# Patient Record
Sex: Male | Born: 1990 | Race: Black or African American | Hispanic: No | Marital: Single | Smoking: Never smoker
Health system: Southern US, Community
[De-identification: ages and names within clinical notes are randomized; demographics above are authoritative.]

## PROBLEM LIST (undated history)

## (undated) DIAGNOSIS — Z789 Other specified health status: Secondary | ICD-10-CM

## (undated) HISTORY — PX: BRAIN SURGERY: SHX531

## (undated) HISTORY — PX: ANKLE FRACTURE SURGERY: SHX122

---

## 2016-02-07 DIAGNOSIS — S82842A Displaced bimalleolar fracture of left lower leg, initial encounter for closed fracture: Secondary | ICD-10-CM | POA: Insufficient documentation

## 2017-09-18 ENCOUNTER — Encounter: Payer: Self-pay | Admitting: Emergency Medicine

## 2017-09-18 ENCOUNTER — Other Ambulatory Visit: Payer: Self-pay

## 2017-09-18 ENCOUNTER — Ambulatory Visit
Admission: EM | Admit: 2017-09-18 | Discharge: 2017-09-18 | Disposition: A | Discharge status: Home / Self Care | Payer: Managed Care, Other (non HMO) | Attending: Family Medicine | Admitting: Family Medicine

## 2017-09-18 DIAGNOSIS — R05 Cough: Secondary | ICD-10-CM

## 2017-09-18 DIAGNOSIS — R059 Cough, unspecified: Secondary | ICD-10-CM

## 2017-09-18 DIAGNOSIS — J0100 Acute maxillary sinusitis, unspecified: Secondary | ICD-10-CM | POA: Diagnosis not present

## 2017-09-18 HISTORY — DX: Other specified health status: Z78.9

## 2017-09-18 LAB — RAPID STREP SCREEN (MED CTR MEBANE ONLY): STREPTOCOCCUS, GROUP A SCREEN (DIRECT): NEGATIVE

## 2017-09-18 MED ORDER — BENZONATATE 100 MG PO CAPS: 100.0000 mg | ORAL_CAPSULE | Freq: Three times a day (TID) | ORAL | 0 refills | Status: DC | PRN

## 2017-09-18 MED ORDER — HYDROCOD POLST-CPM POLST ER 10-8 MG/5ML PO SUER: 5.0000 mL | Freq: Every evening | ORAL | 0 refills | Status: DC | PRN

## 2017-09-18 MED ORDER — DOXYCYCLINE HYCLATE 100 MG PO CAPS: 100.0000 mg | ORAL_CAPSULE | Freq: Two times a day (BID) | ORAL | 0 refills | Status: DC

## 2017-09-18 NOTE — ED Triage Notes (Signed)
Patient in today c/o 2 weeks history of body aches, headache, sore throat, cough, chest and nasal congestion and dizziness. Patient has had chills and hot flashes, but hasn't taken his temperature.

## 2017-09-18 NOTE — Discharge Instructions (Signed)
Take medication as prescribed. Rest. Drink plenty of fluids.  ° °Follow up with your primary care physician this week as needed. Return to Urgent care for new or worsening concerns.  ° °

## 2017-09-18 NOTE — ED Provider Notes (Signed)
[facility] ____________________________________________  Time seen: Approximately  1048 AM  I have reviewed the triage vital signs and the nursing notes.   HISTORY  Chief Complaint flu like symptoms   HPI Tyler Holt is a 26 y.o. male presenting for evaluation of overall 2 weeks of not feeling well.  States 2 weeks ago he started out with runny nose, nasal congestion and some coughing, reports after taking over-the-counter Alka-Seltzer, Sudafed and other decongestion medicines he has started to improve, but symptoms worsened over the last several days.  States symptoms never fully resolved.  States initially had some sore throat that improved and then returned over the last few days, with increased nasal drainage and coughing.  States cough is worse at night.  Denies associated chest pain, shortness of breath, pain with deep breath or hemoptysis.  States does have sinus pressure sensation as well as intermittent frontal headaches as described as moderate.  States able to get a lot of thicker yellowish thick mucus out when coughing and blowing nose.  Reports continues to drink plenty of fluids and overall eating well.  Denies known sick contacts, does work around a lot of people.  States no recent sickness.  States did take some TheraFlu this morning, that states he believes had Tylenol in it.  States has had intermittent chills and warm sensation, not measured his temperature at home, denies known fevers.  Denies other changes. Denies chest pain, shortness of breath, abdominal pain, extremity pain, extremity swelling or rash. Denies recent sickness. Denies recent antibiotic use.    Past Medical History:  Diagnosis Date  . No known health problems     There are no active problems to display for this patient.   Past Surgical History:  Procedure Laterality Date  . ANKLE FRACTURE SURGERY    . 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   Current Outpatient Medications:  .  benzonatate (TESSALON PERLES) 100 MG capsule, Take 1 capsule (100 mg total) by mouth 3 (three) times daily as needed for cough., Disp: 15 capsule, Rfl: 0 .  chlorpheniramine-HYDROcodone (TUSSIONEX PENNKINETIC ER) 10-8 MG/5ML SUER, Take 5 mLs by mouth at bedtime as needed for cough. do not drive or operate machinery while taking as can cause drowsiness., Disp: 75 mL, Rfl: 0 .  doxycycline (VIBRAMYCIN) 100 MG capsule, Take 1 capsule (100 mg total) by mouth 2 (two) times daily., Disp: 20 capsule, Rfl: 0  Allergies Patient has no known allergies.  Family History  Problem Relation Age of Onset  . Hypertension Mother   . Hypertension Father     Social History Social History   Tobacco Use  . Smoking status: Never Smoker  . Smokeless tobacco: Never Used  Substance Use Topics  . Alcohol use: Yes    Comment: socially  . Drug use: No    Review of Systems Constitutional: No fever/chills ENT: Positive sore throat. States irritated.  Cardiovascular: Denies chest pain. Respiratory: Denies shortness of breath. Gastrointestinal: No abdominal pain.   Genitourinary: Negative for dysuria. Musculoskeletal: Negative for back pain. Skin: Negative for rash.  ____________________________________________   PHYSICAL EXAM:  VITAL SIGNS: ED Triage Vitals  Enc Vitals Group     BP 09/18/17 1013 124/70     Pulse Rate 09/18/17 1013 (!) 108 Recheck 90     Resp 09/18/17 1013 16     Temp 09/18/17 1013 99.8 F (37.7 C)  Temp Source 09/18/17 1013 Oral     SpO2 09/18/17 1013 98 %     Weight 09/18/17 1013 250 lb (113.4 kg)     Height 09/18/17 1013 5\' 11"  (1.803 m)     Head Circumference --      Peak Flow --      Pain Score 09/18/17 1014 4     Pain Loc --      Pain Edu? --      Excl. in GC? --     Constitutional: Alert and oriented. Well appearing and in no acute distress. Eyes: Conjunctivae are normal.  Head:  Atraumatic.Mild tenderness to palpation bilateral frontal and Mild to moderate tenderness to palpation bilateral maxillary sinuses. No swelling. No erythema.   Ears: no erythema, normal TMs bilaterally.   Nose: nasal congestion with bilateral nasal turbinate erythema and edema.   Mouth/Throat: Mucous membranes are [email or web address].mild pharyngeal erythema.  No tonsillar swelling or exudate.  Neck: No stridor.  No cervical spine tenderness to palpation. Hematological/Lymphatic/Immunilogical: No cervical lymphadenopathy. Cardiovascular: Normal rate, regular rhythm. Grossly normal heart sounds.  Good peripheral circulation. Respiratory: Normal respiratory effort.  No retractions.  No wheezes, rales or rhonchi. Good air movement.  Musculoskeletal: Steady gait.  No cervical, thoracic or lumbar tenderness to palpation.  Neurologic:  Normal speech and language.  No gait instability. Skin:  Skin is warm, dry and intact. No rash noted. Psychiatric: Mood and affect are normal. Speech and behavior are normal.  ___________________________________________   LABS (all labs ordered are listed, but only abnormal results are displayed)  Labs Reviewed  RAPID STREP SCREEN (NOT AT [facility])  CULTURE, GROUP A STREP [facility])   ____________________________________________   PROCEDURES Procedures    INITIAL IMPRESSION / ASSESSMENT AND PLAN / ED COURSE  Pertinent labs & imaging results that were available during my care of the patient were reviewed by me and considered in my medical decision making (see chart for details).  Well-appearing patient.  No acute distress.  Suspect maxillary sinusitis with postnasal drainage.  Encourage rest, fluids, supportive care.  Will treat patient with oral doxycycline, as needed Tessalon Perles and as needed Tussionex.  Encouraged to continue home Tylenol or ibuprofen at home.  Discussed strict follow-up and return parameters.  Work note given for today and tomorrow. Discussed indication,  risks and benefits of medications with patient.  Discussed follow up with Primary care physician this week as needed.  Discussed follow up and return parameters including no resolution or any worsening concerns. Patient verbalized understanding and agreed to plan.   ____________________________________________   FINAL CLINICAL IMPRESSION(S) / ED DIAGNOSES  Final diagnoses:  Acute maxillary sinusitis, recurrence not specified  Cough     ED Discharge Orders        Ordered    doxycycline (VIBRAMYCIN) 100 MG capsule  2 times daily     09/18/17 1057    benzonatate (TESSALON PERLES) 100 MG capsule  3 times daily PRN     09/18/17 1057    chlorpheniramine-HYDROcodone (TUSSIONEX PENNKINETIC ER) 10-8 MG/5ML SUER  At bedtime PRN     09/18/17 1057       Note: This dictation was prepared with Dragon dictation along with smaller phrase technology. Any transcriptional errors that result from this process are unintentional.         Renford DillsMiller, Tyrone Pautsch, NP 09/18/17 1126

## 2017-09-21 LAB — CULTURE, GROUP A STREP (THRC)

## 2017-11-19 ENCOUNTER — Telehealth: Payer: Self-pay | Admitting: *Deleted

## 2017-11-19 NOTE — Telephone Encounter (Signed)
error 

## 2018-07-06 ENCOUNTER — Emergency Department
Admission: EM | Admit: 2018-07-06 | Discharge: 2018-07-07 | Disposition: A | Discharge status: Home / Self Care | Payer: Managed Care, Other (non HMO) | Attending: Emergency Medicine | Admitting: Emergency Medicine

## 2018-07-06 ENCOUNTER — Encounter: Payer: Self-pay | Admitting: Emergency Medicine

## 2018-07-06 ENCOUNTER — Emergency Department: Payer: Managed Care, Other (non HMO)

## 2018-07-06 DIAGNOSIS — R109 Unspecified abdominal pain: Secondary | ICD-10-CM | POA: Diagnosis present

## 2018-07-06 DIAGNOSIS — Z87891 Personal history of nicotine dependence: Secondary | ICD-10-CM | POA: Insufficient documentation

## 2018-07-06 DIAGNOSIS — N200 Calculus of kidney: Secondary | ICD-10-CM | POA: Diagnosis not present

## 2018-07-06 LAB — URINALYSIS, COMPLETE (UACMP) WITH MICROSCOPIC
BACTERIA UA: NONE SEEN
Bilirubin Urine: NEGATIVE
Glucose, UA: NEGATIVE mg/dL
Ketones, ur: 5 mg/dL — AB
Leukocytes, UA: NEGATIVE
NITRITE: NEGATIVE
Protein, ur: 30 mg/dL — AB
RBC / HPF: >50 RBC/hpf — ABNORMAL HIGH (ref 0–5)
SPECIFIC GRAVITY, URINE: 1.027 (ref 1.005–1.030)
Squamous Epithelial / LPF: NONE SEEN (ref 0–5)
pH: 6 (ref 5.0–8.0)

## 2018-07-06 LAB — COMPREHENSIVE METABOLIC PANEL
ALBUMIN: 4.8 g/dL (ref 3.5–5.0)
ALK PHOS: 55 U/L (ref 38–126)
ALT: 28 U/L (ref 0–44)
AST: 31 U/L (ref 15–41)
Anion gap: 7 (ref 5–15)
BUN: 13 mg/dL (ref 6–20)
CALCIUM: 9.3 mg/dL (ref 8.9–10.3)
CO2: 26 mmol/L (ref 22–32)
Chloride: 107 mmol/L (ref 98–111)
Creatinine, Ser: 1.03 mg/dL (ref 0.61–1.24)
GFR calc Af Amer: >60 mL/min (ref >60)
GFR calc non Af Amer: >60 mL/min (ref >60)
GLUCOSE: 102 mg/dL — AB (ref 70–99)
Potassium: 3.3 mmol/L — ABNORMAL LOW (ref 3.5–5.1)
Sodium: 140 mmol/L (ref 135–145)
Total Bilirubin: 1.1 mg/dL (ref 0.3–1.2)
Total Protein: 8 g/dL (ref 6.5–8.1)

## 2018-07-06 LAB — CBC
HEMATOCRIT: 40.8 % (ref 40.0–52.0)
Hemoglobin: 14.3 g/dL (ref 13.0–18.0)
MCH: 27 pg (ref 26.0–34.0)
MCHC: 35 g/dL (ref 32.0–36.0)
MCV: 77 fL — ABNORMAL LOW (ref 80.0–100.0)
Platelets: 290 10*3/uL (ref 150–440)
RBC: 5.3 MIL/uL (ref 4.40–5.90)
RDW: 13.8 % (ref 11.5–14.5)
WBC: 12.1 10*3/uL — ABNORMAL HIGH (ref 3.8–10.6)

## 2018-07-06 MED ORDER — MORPHINE SULFATE (PF) 4 MG/ML IV SOLN
4.0000 mg | Freq: Once | INTRAVENOUS | Status: AC
  Administered 2018-07-07: 4 mg via INTRAVENOUS
  Filled 2018-07-06: qty 1

## 2018-07-06 MED ORDER — ONDANSETRON HCL 4 MG/2ML IJ SOLN
4.0000 mg | Freq: Once | INTRAMUSCULAR | Status: AC
Start: 2018-07-06 — End: 2018-07-07
  Administered 2018-07-07: 4 mg via INTRAVENOUS
  Filled 2018-07-06: qty 2

## 2018-07-06 MED ORDER — SODIUM CHLORIDE 0.9 % IV BOLUS
1000.0000 mL | Freq: Once | INTRAVENOUS | Status: AC
  Administered 2018-07-07: 1000 mL via INTRAVENOUS

## 2018-07-06 NOTE — ED Notes (Signed)
Dr Brown and Butch, RN at bedside at this time. 

## 2018-07-06 NOTE — ED Triage Notes (Signed)
Patient with complaint of sudden onset left lower abdominal pain with nausea that started about 2 hours ago.

## 2018-07-06 NOTE — ED Provider Notes (Signed)
[facility] Emergency Department Provider Note    First MD Initiated Contact with Patient 07/06/18 2337     (approximate)  I have reviewed the triage vital signs and the nursing notes.   HISTORY  Chief Complaint Abdominal Pain    HPI Tyler Holt is a 27 y.o. male presents with acute onset of 10 out of 10 left flank painwhich began to have before arrival to the emergency department with accompanying nausea and episode of nonbloody nonbilious vomiting. Patient denies any fever. Patient denies any hematuria. Patient denies any diarrhea or constipation.  Past Medical History:  Diagnosis Date  . No known health problems     There are no active problems to display for this patient.   Past Surgical History:  Procedure Laterality Date  . ANKLE FRACTURE SURGERY    . 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 Authorizing Provider  benzonatate (TESSALON PERLES) 100 MG capsule Take 1 capsule (100 mg total) by mouth 3 (three) times daily as needed for cough. 09/18/17   Renford DillsMiller, Lindsey, NP  chlorpheniramine-HYDROcodone [facility](TUSSIONEX PENNKINETIC ER) 10-8 MG/5ML SUER Take 5 mLs by mouth at bedtime as needed for cough. do not drive or operate machinery while taking as can cause drowsiness. 09/18/17   Renford DillsMiller, Lindsey, NP  doxycycline (VIBRAMYCIN) 100 MG capsule Take 1 capsule (100 mg total) by mouth 2 (two) times daily. 09/18/17   Renford DillsMiller, Lindsey, NP  oxyCODONE-acetaminophen (PERCOCET) 5-325 MG tablet Take 1 tablet by mouth every 4 (four) hours as needed for severe pain. 07/07/18 07/07/19  Darci CurrentBrown, [place] N, MD  tamsulosin [facility](FLOMAX) 0.4 MG CAPS capsule Take 1 capsule (0.4 mg total) by mouth daily after breakfast. 07/07/18   Darci CurrentBrown, [place] N, MD    Allergies no known drug allergies  Family History  Problem Relation Age of Onset  . Hypertension Mother   . Hypertension Father     Social  History Social History   Tobacco Use  . Smoking status: Never Smoker  . Smokeless tobacco: Never Used  Substance Use Topics  . Alcohol use: Yes    Comment: socially  . Drug use: No    Review of Systems Constitutional: No fever/chills Eyes: No visual changes. ENT: No sore throat. Cardiovascular: Denies chest pain. Respiratory: Denies shortness of breath. Gastrointestinal:positive for left flank pain. Positive for vomiting.  No  vomiting.  No diarrhea.  No constipation. Genitourinary: Negative for dysuria. Musculoskeletal: Negative for neck pain.  Negative for back pain. Integumentary: Negative for rash. Neurological: Negative for headaches, focal weakness or numbness.   ____________________________________________   PHYSICAL EXAM:  VITAL SIGNS: ED Triage Vitals [07/06/18 2140]  Enc Vitals Group     BP 125/73     Pulse Rate 82     Resp 18     Temp 98.3 F (36.8 C)     Temp Source Oral     SpO2 98 %     Weight 124.7 kg (275 lb)     Height 1.803 m (5\' 11" )     Head Circumference      Peak Flow      Pain Score 9     Pain Loc      Pain Edu?      Excl. in GC?     Constitutional: Alert and oriented. Well appearing and in no acute distress. Eyes: Conjunctivae are normal.  Head: Atraumatic. Mouth/Throat: Mucous  membranes are moist.  Oropharynx non-erythematous. Neck: No stridor.   Cardiovascular: Normal rate, regular rhythm. Good peripheral circulation. Grossly normal heart sounds. Respiratory: Normal respiratory effort.  No retractions. Lungs CTAB. Gastrointestinal: Soft and nontender. No distention.   Musculoskeletal: No lower extremity tenderness nor edema. No gross deformities of extremities. Neurologic:  Normal speech and language. No gross focal neurologic deficits are appreciated.  Skin:  Skin is warm, dry and intact. No rash noted. Psychiatric: Mood and affect are normal. Speech and behavior are normal.  ____________________________________________    LABS (all labs ordered are listed, but only abnormal results are displayed)  Labs Reviewed  COMPREHENSIVE METABOLIC PANEL - Abnormal; Notable for the following components:      Result Value   Potassium 3.3 (*)    Glucose, Bld 102 (*)    All other components within normal limits  CBC - Abnormal; Notable for the following components:   WBC 12.1 (*)    MCV 77.0 (*)    All other components within normal limits  URINALYSIS, COMPLETE (UACMP) WITH MICROSCOPIC - Abnormal; Notable for the following components:   Color, Urine YELLOW (*)    APPearance CLEAR (*)    Hgb urine dipstick MODERATE (*)    Ketones, ur 5 (*)    Protein, ur 30 (*)    RBC / HPF >50 (*)    All other components within normal limits    RADIOLOGY I, [place] N BROWN, personally viewed and evaluated these images (plain radiographs) as part of my medical decision making, as well as reviewing the written report by the radiologist.  ED MD interpretation:  3 mm left UVJ stone with associated hydronephrosis  Official radiology report(s): Ct Renal Stone Study  Result Date: 07/07/2018 CLINICAL DATA:  27 y/o M; sudden onset left lower abdominal pain with nausea 2 hours ago. EXAM: CT ABDOMEN AND PELVIS WITHOUT CONTRAST TECHNIQUE: Multidetector CT imaging of the abdomen and pelvis was performed following the standard protocol without IV contrast. COMPARISON:  None. FINDINGS: Lower chest: No acute abnormality. Hepatobiliary: No focal liver abnormality is seen. No gallstones, gallbladder wall thickening, or biliary dilatation. Pancreas: Unremarkable. No pancreatic ductal dilatation or surrounding inflammatory changes. Spleen: Normal in size without focal abnormality. Adrenals/Urinary Tract: Adrenal glands are unremarkable. Punctate nonobstructing kidney stones bilaterally. Mild left hydronephrosis and perinephric stranding with a 3 mm stone at the left ureterovesicular junction (series 2, image 79 and series 5, image 98). No right ureter  stone. Normal bladder. Stomach/Bowel: Stomach is within normal limits. Appendix not identified, no pericecal inflammation. No evidence of bowel wall thickening, distention, or inflammatory changes. Vascular/Lymphatic: No significant vascular findings are present. No enlarged abdominal or pelvic lymph nodes. Reproductive: Prostate is unremarkable. Other: No abdominal wall hernia or abnormality. No abdominopelvic ascites. Musculoskeletal: No acute or significant osseous findings. IMPRESSION: 1. Mild left hydronephrosis with 3 mm ureter stone at the left ureterovesicular junction. 2. Punctate nonobstructing kidney stones bilaterally. Electronically Signed   By: Mitzi Hansen M.D.   On: 07/07/2018 00:14     Procedures   ____________________________________________   INITIAL IMPRESSION / ASSESSMENT AND PLAN / ED COURSE  As part of my medical decision making, I reviewed the following data within the electronic MEDICAL RECORD NUMBER   27 year old male presenting with a busted a history of physical exam concerning for possible left ureterolithiasis and a such CT scan was performed. CT confirm findings of ureterolithiasis. Patient given IV morphine and Zofran in the emergency department repeat pain score 2 out of  10. Patient also given 1 L IV normal saline will be referred to urology ____________________________________________  FINAL CLINICAL IMPRESSION(S) / ED DIAGNOSES  Final diagnoses:  Kidney stone on left side     MEDICATIONS GIVEN DURING THIS VISIT:  Medications  morphine 4 MG/ML injection 4 mg (4 mg Intravenous Given 07/07/18 0008)  ondansetron (ZOFRAN) injection 4 mg (4 mg Intravenous Given 07/07/18 0007)  sodium chloride 0.9 % bolus 1,000 mL (0 mLs Intravenous Stopped 07/07/18 0111)     ED Discharge Orders         Ordered    oxyCODONE-acetaminophen (PERCOCET) 5-325 MG tablet  Every 4 hours PRN     07/07/18 0143    tamsulosin (FLOMAX) 0.4 MG CAPS capsule  Daily after breakfast      07/07/18 0143           Note:  This document was prepared using Dragon voice recognition software and may include unintentional dictation errors.    Darci Current, MD 07/07/18 [phone]

## 2018-07-07 MED ORDER — OXYCODONE-ACETAMINOPHEN 5-325 MG PO TABS
ORAL_TABLET | ORAL | Status: AC
  Filled 2018-07-07: qty 1

## 2018-07-07 MED ORDER — OXYCODONE-ACETAMINOPHEN 5-325 MG PO TABS: 1.0000 | ORAL_TABLET | ORAL | 0 refills | Status: DC | PRN

## 2018-07-07 MED ORDER — OXYCODONE-ACETAMINOPHEN 5-325 MG PO TABS
1.0000 | ORAL_TABLET | Freq: Once | ORAL | Status: AC
  Administered 2018-07-07: 1 via ORAL

## 2018-07-07 MED ORDER — TAMSULOSIN HCL 0.4 MG PO CAPS: 0.4000 mg | ORAL_CAPSULE | Freq: Every day | ORAL | 0 refills | Status: DC

## 2018-07-11 ENCOUNTER — Encounter: Payer: Self-pay | Admitting: Urology

## 2018-07-11 ENCOUNTER — Other Ambulatory Visit: Payer: Self-pay | Admitting: Urology

## 2018-07-11 ENCOUNTER — Other Ambulatory Visit: Admission: RE | Admit: 2018-07-11 | Payer: Managed Care, Other (non HMO) | Source: Ambulatory Visit | Admitting: Urology

## 2018-07-11 ENCOUNTER — Ambulatory Visit (INDEPENDENT_AMBULATORY_CARE_PROVIDER_SITE_OTHER): Payer: Managed Care, Other (non HMO) | Admitting: Urology

## 2018-07-11 VITALS — BP 129/71 | HR 100 | Ht 70.0 in | Wt 275.0 lb

## 2018-07-11 DIAGNOSIS — R3129 Other microscopic hematuria: Secondary | ICD-10-CM

## 2018-07-11 DIAGNOSIS — N132 Hydronephrosis with renal and ureteral calculous obstruction: Secondary | ICD-10-CM | POA: Diagnosis not present

## 2018-07-11 DIAGNOSIS — N201 Calculus of ureter: Secondary | ICD-10-CM

## 2018-07-11 NOTE — Progress Notes (Signed)
07/11/2018 11:42 AM   Cathlean Cower 1991-03-25 [id number]  Referring provider: No referring provider defined for this encounter.  Chief Complaint  Patient presents with  . Nephrolithiasis    New Patient    HPI: Patient is a 27 year old [place] male who was referred by [facility] ED for nephrolithiasis.    He presented to the ED on 07/06/2018 with the complaint of left flank pain with nausea and vomiting.   CT renal stone study revealed mild left hydronephrosis with 3 mm ureter stone at the left ureterovesicular junction.  Punctate nonobstructing kidney stones bilaterally.   Labs in the ED:  UA > 50 RBC's and 0-5 WBC's, WBC count 12.1 and serum creatinine 1.03.     Meds given in the ED: Percocet, Flomax, Zofran and morphine.   Prior urological history:  None.  His mother has a history of stones.     Current NSAID/anticoagulation:   aleve    Today, he is having frequency, urgency, dysuria and nocturia.   Patient denies any gross hematuria, dysuria or suprapubic/flank pain.  Patient denies any fevers, chills, nausea or vomiting.   He is drinking half gallon of water daily.  Soda on occassions.  He is drinking cran/grape juice.  He drinks lemonade and tea.  Alcohol on occassions.   He eats mostly chicken.  He uses a lot of salt.    UA was negative.     PMH: Past Medical History:  Diagnosis Date  . No known health problems     Surgical History: Past Surgical History:  Procedure Laterality Date  . ANKLE FRACTURE SURGERY    . 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 Always use your most recent med list.          ibuprofen 800 MG tablet Commonly known as:  ADVIL,MOTRIN Take by mouth.   tamsulosin 0.4 MG Caps capsule Commonly known as:  FLOMAX Take 1 capsule (0.4 mg total) by mouth daily after breakfast.       Allergies:  No Known Allergies  Family History: Family History  Problem Relation Age of Onset  . Hypertension Mother   . Hypertension Father     Social History:  reports that he has never smoked. He has never used smokeless tobacco. He reports that he drinks alcohol. He reports that he does not use drugs.  ROS: UROLOGY Frequent Urination?: Yes Hard to postpone urination?: Yes Burning/pain with urination?: Yes Get up at night to urinate?: Yes Leakage of urine?: No Urine stream starts and stops?: No Trouble starting stream?: No Do you have to strain to urinate?: No Blood in urine?: No Urinary tract infection?: No Sexually transmitted disease?: No Injury to kidneys or bladder?: No Painful intercourse?: No Weak stream?: No Erection problems?: No Penile pain?: No  Gastrointestinal Nausea?: No Vomiting?: No Indigestion/heartburn?: Yes Diarrhea?: No Constipation?: Yes  Constitutional Fever: No Night sweats?: No Weight loss?: No Fatigue?: No  Skin Skin rash/lesions?: No Itching?: No  Eyes Blurred vision?: No Double vision?: No  Ears/Nose/Throat Sore throat?: No Sinus problems?: No  Hematologic/Lymphatic Swollen glands?: No Easy bruising?: No  Cardiovascular Leg swelling?: No Chest pain?: No  Respiratory Cough?: No Shortness of breath?: No  Endocrine Excessive thirst?: No  Musculoskeletal Back pain?: No Joint pain?: No  Neurological  Headaches?: Yes Dizziness?: No  Psychologic Depression?: No Anxiety?: No  Physical Exam: BP 129/71   Pulse 100   Ht 5\' 10"  (1.778 m)   Wt 275 lb (124.7 kg)   BMI 39.46 kg/m   Constitutional:  Well nourished. Alert and oriented, No acute distress. HEENT: [place] AT, moist mucus membranes.  Trachea midline, no masses. Cardiovascular: No clubbing, cyanosis, or edema. Respiratory: Normal respiratory effort, no increased work of breathing. GI: Abdomen is soft, non tender, non distended, no abdominal masses. Liver and spleen  not palpable.  No hernias appreciated.  Stool sample for occult testing is not indicated.   GU: No CVA tenderness.  No bladder fullness or masses.  Patient with circumcised phallus.   Urethral meatus is patent.  No penile discharge. No penile lesions or rashes. Scrotum without lesions, cysts, rashes and/or edema.  Testicles are located scrotally bilaterally. No masses are appreciated in the testicles. Left and right epididymis are normal.   Skin: No rashes, bruises or suspicious lesions. Lymph: No cervical or inguinal adenopathy. Neurologic: Grossly intact, no focal deficits, moving all 4 extremities. Psychiatric: Normal mood and affect.  Laboratory Data: Lab Results  Component Value Date   WBC 12.1 (H) 07/06/2018   HGB 14.3 07/06/2018   HCT 40.8 07/06/2018   MCV 77.0 (L) 07/06/2018   PLT 290 07/06/2018    Lab Results  Component Value Date   CREATININE 1.03 07/06/2018    No results found for: PSA  No results found for: TESTOSTERONE  No results found for: HGBA1C  No results found for: TSH  No results found for: CHOL, HDL, CHOLHDL, VLDL, LDLCALC  Lab Results  Component Value Date   AST 31 07/06/2018   Lab Results  Component Value Date   ALT 28 07/06/2018   No components found for: ALKALINEPHOPHATASE No components found for: BILIRUBINTOTAL  No results found for: ESTRADIOL  Urinalysis Negative see Epic.   I have reviewed the labs.   Pertinent Imaging: CLINICAL DATA:  27 y/o M; sudden onset left lower abdominal pain with nausea 2 hours ago.  EXAM: CT ABDOMEN AND PELVIS WITHOUT CONTRAST  TECHNIQUE: Multidetector CT imaging of the abdomen and pelvis was performed following the standard protocol without IV contrast.  COMPARISON:  None.  FINDINGS: Lower chest: No acute abnormality.  Hepatobiliary: No focal liver abnormality is seen. No gallstones, gallbladder wall thickening, or biliary dilatation.  Pancreas: Unremarkable. No pancreatic ductal  dilatation or surrounding inflammatory changes.  Spleen: Normal in size without focal abnormality.  Adrenals/Urinary Tract: Adrenal glands are unremarkable. Punctate nonobstructing kidney stones bilaterally. Mild left hydronephrosis and perinephric stranding with a 3 mm stone at the left ureterovesicular junction (series 2, image 79 and series 5, image 98). No right ureter stone. Normal bladder.  Stomach/Bowel: Stomach is within normal limits. Appendix not identified, no pericecal inflammation. No evidence of bowel wall thickening, distention, or inflammatory changes.  Vascular/Lymphatic: No significant vascular findings are present. No enlarged abdominal or pelvic lymph nodes.  Reproductive: Prostate is unremarkable.  Other: No abdominal wall hernia or abnormality. No abdominopelvic ascites.  Musculoskeletal: No acute or significant osseous findings.  IMPRESSION: 1. Mild left hydronephrosis with 3 mm ureter stone at the left ureterovesicular junction. 2. Punctate nonobstructing kidney stones bilaterally.   Electronically Signed   By: Mitzi Hansen M.D.   On: 07/07/2018 00:14   I have independently reviewed the films.    Assessment & Plan:    1. Left ureteral stone Patient has not had intense flank  pain associated with N/V since the ED visit Likely passed  2. Left hydronephrosis Obtain RUS to ensure the hydronephrosis has resolved - it is explained to the patient that it is important to document resolution of the hydronephrosis as "silent hydronephrosis" can occur and cause damage and/or loss of the kidney  3. Microscopic hematuria UA today demonstrates no hematuria continue to monitor the patient's UA after the treatment/passage of the stone to ensure the hematuria has resolved if hematuria persists, we will pursue a hematuria workup with CT Urogram and cystoscopy if appropriate.    Return in about 2 weeks (around 07/25/2018) for RUS report  and UA .  These notes generated with voice recognition software. I apologize for typographical errors.  Michiel Cowboy, PA-C  [facility] [address]  [address] [place], [place] [address] [phone]

## 2018-07-14 ENCOUNTER — Telehealth: Payer: Self-pay | Admitting: Family Medicine

## 2018-07-14 LAB — MICROSCOPIC EXAMINATION: Casts: NONE SEEN /lpf

## 2018-07-14 LAB — URINALYSIS, COMPLETE
BILIRUBIN UA: NEGATIVE
GLUCOSE, UA: NEGATIVE
KETONES UA: NEGATIVE
Leukocytes, UA: NEGATIVE
NITRITE UA: NEGATIVE
PROTEIN UA: NEGATIVE
RBC, UA: NEGATIVE
Specific Gravity, UA: 1.023 (ref 1.005–1.030)
Urobilinogen, Ur: 1 mg/dL (ref 0.2–1.0)
pH, UA: 7 (ref 5.0–7.5)

## 2018-07-14 LAB — CULTURE, URINE COMPREHENSIVE

## 2018-07-14 NOTE — Telephone Encounter (Signed)
-----   Message from Harle Battiest, PA-C sent at 07/14/2018  1:01 PM EDT ----- Please let Mr. Gerling know that his urine culture grew out a skin contaminate.  No need to treat.

## 2018-07-14 NOTE — Telephone Encounter (Signed)
LMOM for patient to return call.

## 2018-07-15 NOTE — Telephone Encounter (Signed)
Patient notified

## 2018-07-23 ENCOUNTER — Ambulatory Visit
Admission: RE | Admit: 2018-07-23 | Discharge: 2018-07-23 | Disposition: A | Discharge status: Home / Self Care | Payer: Managed Care, Other (non HMO) | Source: Ambulatory Visit | Attending: Urology | Admitting: Urology

## 2018-07-23 DIAGNOSIS — N201 Calculus of ureter: Secondary | ICD-10-CM

## 2018-07-23 DIAGNOSIS — Z09 Encounter for follow-up examination after completed treatment for conditions other than malignant neoplasm: Secondary | ICD-10-CM | POA: Insufficient documentation

## 2018-07-23 DIAGNOSIS — Z87442 Personal history of urinary calculi: Secondary | ICD-10-CM | POA: Diagnosis present

## 2018-07-28 ENCOUNTER — Ambulatory Visit: Payer: Managed Care, Other (non HMO) | Admitting: Urology

## 2018-07-28 ENCOUNTER — Encounter: Payer: Self-pay | Admitting: Urology

## 2018-07-28 ENCOUNTER — Ambulatory Visit (INDEPENDENT_AMBULATORY_CARE_PROVIDER_SITE_OTHER): Payer: Managed Care, Other (non HMO) | Admitting: Urology

## 2018-07-28 VITALS — BP 137/84 | HR 99 | Ht 71.0 in | Wt 277.9 lb

## 2018-07-28 DIAGNOSIS — N132 Hydronephrosis with renal and ureteral calculous obstruction: Secondary | ICD-10-CM | POA: Diagnosis not present

## 2018-07-28 DIAGNOSIS — N201 Calculus of ureter: Secondary | ICD-10-CM

## 2018-07-28 DIAGNOSIS — R3129 Other microscopic hematuria: Secondary | ICD-10-CM | POA: Diagnosis not present

## 2018-07-28 LAB — URINALYSIS, COMPLETE
BILIRUBIN UA: NEGATIVE
Glucose, UA: NEGATIVE
KETONES UA: NEGATIVE
LEUKOCYTES UA: NEGATIVE
NITRITE UA: NEGATIVE
PH UA: 7 (ref 5.0–7.5)
Protein, UA: NEGATIVE
RBC UA: NEGATIVE
SPEC GRAV UA: 1.025 (ref 1.005–1.030)
UUROB: 1 mg/dL (ref 0.2–1.0)

## 2018-07-28 NOTE — Progress Notes (Signed)
07/28/2018 9:05 AM   Tyler Holt 10-28-1990 [id number]  Referring provider: No referring provider defined for this encounter.  Chief Complaint  Patient presents with  . Nephrolithiasis    HPI: Patient is a 27 year old [place] male with a history of nephrolithiasis who presents today to discuss his renal ultrasound report and review UA for microscopic hematuria.     Background history  He presented to the ED on 07/06/2018 with the complaint of left flank pain with nausea and vomiting.   CT renal stone study revealed mild left hydronephrosis with 3 mm ureter stone at the left ureterovesicular junction.  Punctate nonobstructing kidney stones bilaterally.  Labs in the ED:  UA > 50 RBC's and 0-5 WBC's, WBC count 12.1 and serum creatinine 1.03.   Meds given in the ED: Percocet, Flomax, Zofran and morphine.   Prior urological history:  None.  His mother has a history of stones.     Current NSAID/anticoagulation:   Aleve   UA was negative for hematuria.  He is drinking half gallon of water daily.  Soda on occassions.  He is drinking cran/grape juice.  He drinks lemonade and tea.  Alcohol on occassions.   He eats mostly chicken.  He uses a lot of salt.    RUS on 07/23/2018 revealed resolved left hydronephrosis since 07/06/2018. Left nephrolithiasis seen at that time is not evident by ultrasound.  Otherwise normal ultrasound appearance of both kidneys and urinary bladder  Today, he does not have any further flank pain.  He also is not having difficulty with urination.  Patient denies any gross hematuria, dysuria or suprapubic/flank pain.  Patient denies any fevers, chills, nausea or vomiting.   UA negative.       PMH: Past Medical History:  Diagnosis Date  . No known health problems     Surgical History: Past Surgical History:  Procedure Laterality Date  . ANKLE FRACTURE SURGERY    . 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 Always use your most recent med list.          ibuprofen 800 MG tablet Commonly known as:  ADVIL,MOTRIN Take by mouth.   naproxen sodium 220 MG tablet Commonly known as:  ALEVE Take 220 mg by mouth daily as needed.       Allergies: No Known Allergies  Family History: Family History  Problem Relation Age of Onset  . Hypertension Mother   . Hypertension Father     Social History:  reports that he has never smoked. He has never used smokeless tobacco. He reports that he drinks alcohol. He reports that he does not use drugs.  ROS: UROLOGY Frequent Urination?: No Hard to postpone urination?: No Burning/pain with urination?: No Get up at night to urinate?: No Leakage of urine?: No Urine stream starts and stops?: No Trouble starting stream?: No Do you have to strain to urinate?: No Blood in urine?: No Sexually transmitted disease?: No Injury to kidneys or bladder?: No Painful intercourse?: No Weak stream?: No Erection problems?: No Penile pain?: No  Gastrointestinal Nausea?: No Indigestion/heartburn?: No Diarrhea?: No Constipation?: No  Constitutional Fever: No Night sweats?: No Weight loss?: No Fatigue?: No  Skin Itching?: No  Eyes Blurred vision?: No Double vision?: No  Ears/Nose/Throat Sore  throat?: No Sinus problems?: No  Hematologic/Lymphatic Swollen glands?: No  Cardiovascular Leg swelling?: No Chest pain?: No  Respiratory Cough?: No Shortness of breath?: No  Endocrine Excessive thirst?: No  Musculoskeletal Joint pain?: No  Neurological Headaches?: No Dizziness?: No  Psychologic Depression?: No Anxiety?: No  Physical Exam: BP 137/84 (BP Location: Left Arm, Patient Position: Sitting, Cuff Size: Normal)   Pulse 99   Ht 5\' 11"  (1.803 m)   Wt 277 lb 14.4 oz (126.1 kg)   BMI 38.76 kg/m   Constitutional: Well nourished. Alert and  oriented, No acute distress. HEENT: [place] AT, moist mucus membranes. Trachea midline, no masses. Cardiovascular: No clubbing, cyanosis, or edema. Respiratory: Normal respiratory effort, no increased work of breathing. GI: Abdomen is soft, non tender, non distended, no abdominal masses. Liver and spleen not palpable.  No hernias appreciated.  Stool sample for occult testing is not indicated.   GU: Obese. No CVA tenderness.  No bladder fullness or masses.   Skin: No rashes, bruises or suspicious lesions. Lymph: No cervical or inguinal adenopathy. Neurologic: Grossly intact, no focal deficits, moving all 4 extremities. Psychiatric: Normal mood and affect.  Laboratory Data: Lab Results  Component Value Date   WBC 12.1 (H) 07/06/2018   HGB 14.3 07/06/2018   HCT 40.8 07/06/2018   MCV 77.0 (L) 07/06/2018   PLT 290 07/06/2018    Lab Results  Component Value Date   CREATININE 1.03 07/06/2018    No results found for: PSA  No results found for: TESTOSTERONE  No results found for: HGBA1C  No results found for: TSH  No results found for: CHOL, HDL, CHOLHDL, VLDL, LDLCALC  Lab Results  Component Value Date   AST 31 07/06/2018   Lab Results  Component Value Date   ALT 28 07/06/2018   No components found for: ALKALINEPHOPHATASE No components found for: BILIRUBINTOTAL  No results found for: ESTRADIOL  Urinalysis Negative see Epic.   I have reviewed the labs.   Pertinent Imaging: CLINICAL DATA:  27 year old male with left side obstructive uropathy due to ureteral calculus last month. Nephrolithiasis.  EXAM: RENAL / URINARY TRACT ULTRASOUND COMPLETE  COMPARISON:  CT Abdomen and Pelvis 07/06/2018  FINDINGS: Right Kidney:  Length: 11.9 centimeters. Echogenicity within normal limits. No mass or hydronephrosis visualized.  Left Kidney:  Length: 12.3 centimeters. Resolved left hydronephrosis. Normal cortical echogenicity. Punctate left lower pole  region nephrolithiasis is not evident today. No left renal mass or lesion.  Bladder:  Appears normal for degree of bladder distention.  IMPRESSION: 1. Resolved left hydronephrosis since 07/06/2018. Left nephrolithiasis seen at that time is not evident by ultrasound. 2. Otherwise normal ultrasound appearance of both kidneys and urinary bladder.   Electronically Signed   By: Odessa Fleming M.D.   On: 07/24/2018 08:15   I have independently reviewed the films and no hydro is seen.   Assessment & Plan:    1. Left ureteral stone Likely passed Discussed undergoing a metabolic workup- he defers at this time Given the ABC's of kidney stones booklet  2. Left hydronephrosis hydronephrosis has resolved   3. Microscopic hematuria UA today demonstrates no hematuria   Return if symptoms worsen or fail to improve.  These notes generated with voice recognition software. I apologize for typographical errors.  Michiel Cowboy, PA-C  [facility] [address]  [address] [place], [place] [address] [phone]   I spent 15 with this patient reviewing his renal ultrasound results and discussing options for prevention  of stones of which greater than 50% was spent in counseling and coordination of care with the patient.

## 2019-12-12 IMAGING — CT CT RENAL STONE PROTOCOL
2 of 4 series · 17 of 46 positions shown, 19 images · non-contrast
Comparison: None.

CLINICAL DATA: 27 y/o M; sudden onset left lower abdominal pain
with nausea 2 hours ago.

EXAM:
CT ABDOMEN AND PELVIS WITHOUT CONTRAST
TECHNIQUE: Multidetector CT imaging of the abdomen and pelvis was performed
following the standard protocol without IV contrast.

[Series 2: stone full standard · axial · 0.66mm/px · z∈[+788,+1228]mm · 14 of 96 slices shown, 16 images]
[im 4/96  soft-tissue]
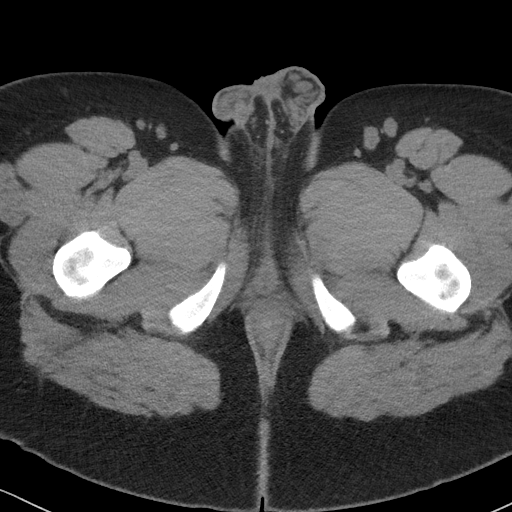
[im 4/96  bone]
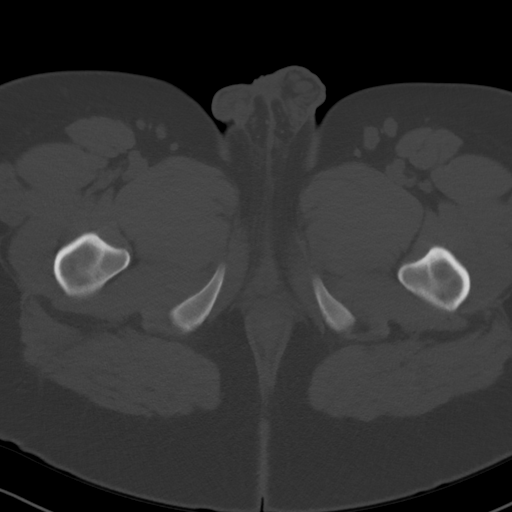
[im 12/96  soft-tissue]
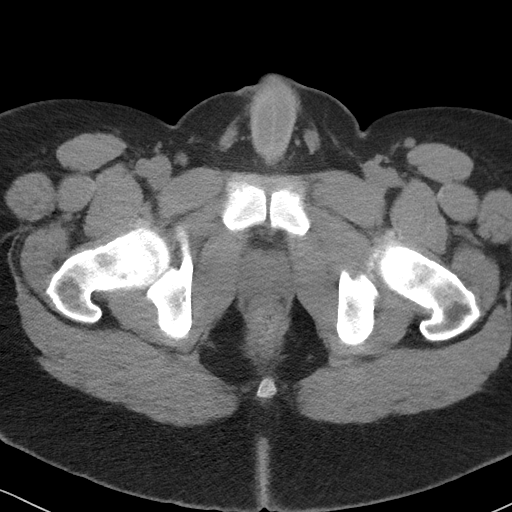
[im 20/96  soft-tissue]
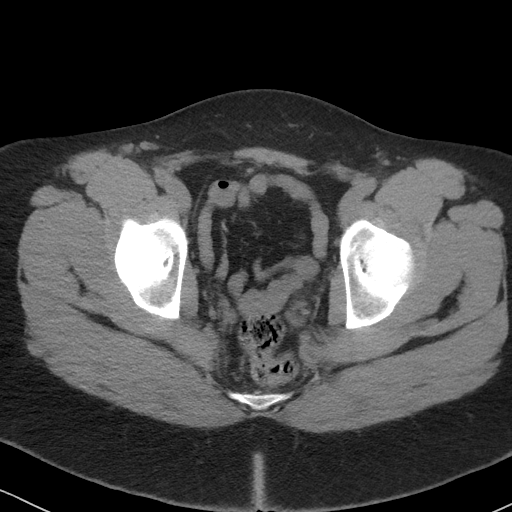
[im 27/96  soft-tissue]
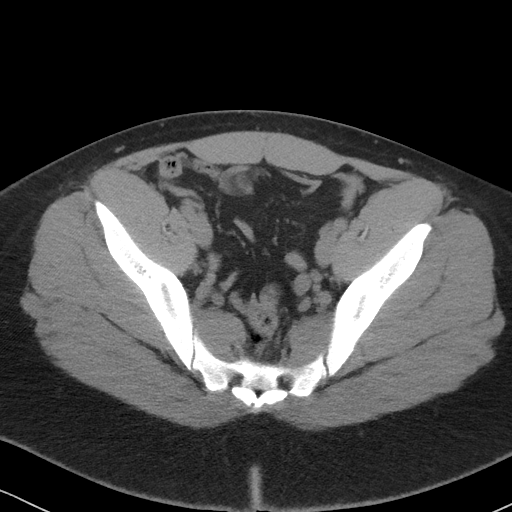
[im 31/96  soft-tissue]
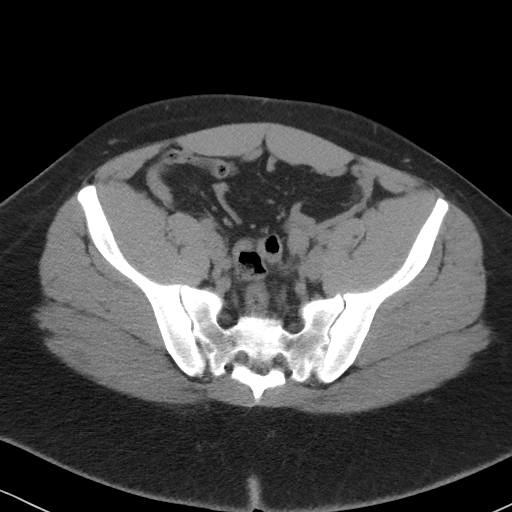
[im 39/96  soft-tissue]
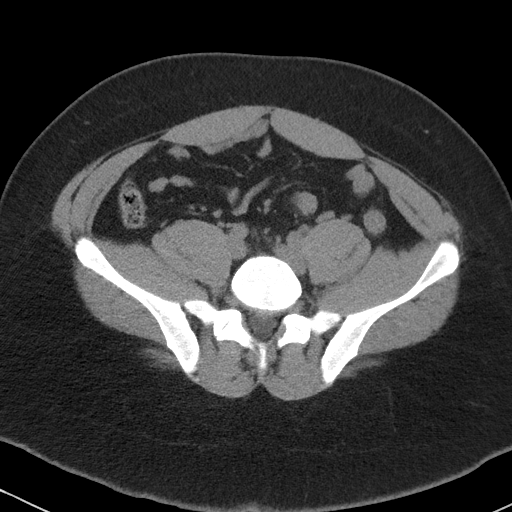
[im 46/96  soft-tissue]
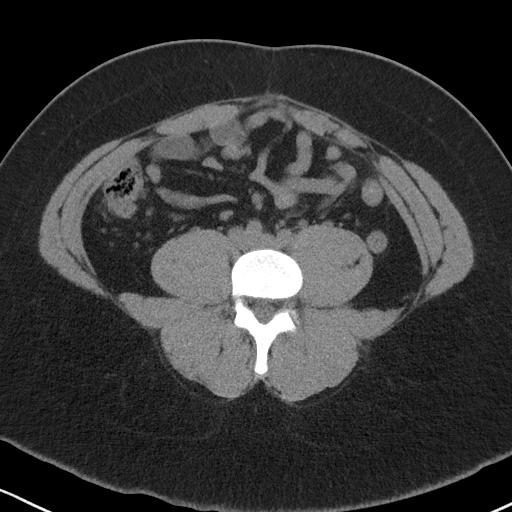
[im 50/96  soft-tissue]
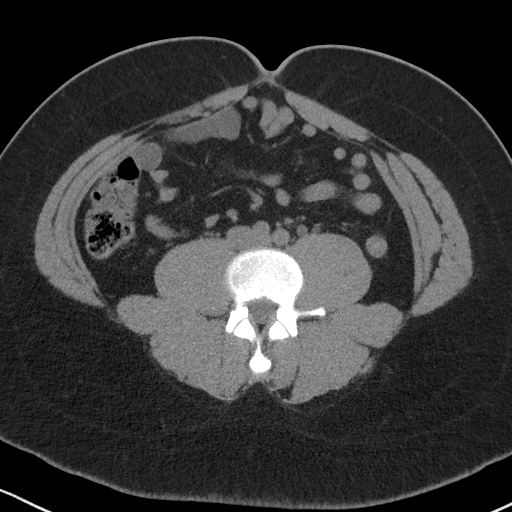
[im 58/96  soft-tissue]
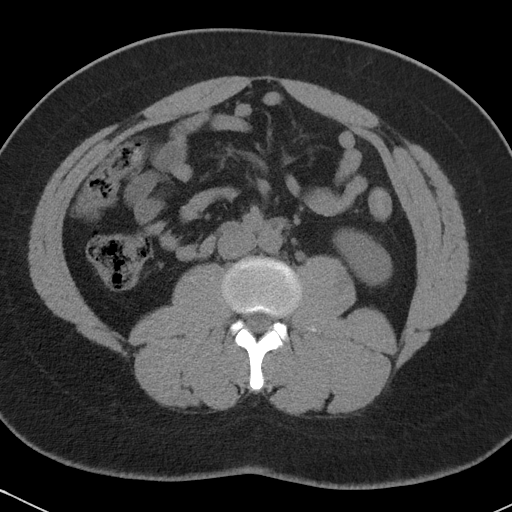
[im 58/96  bone]
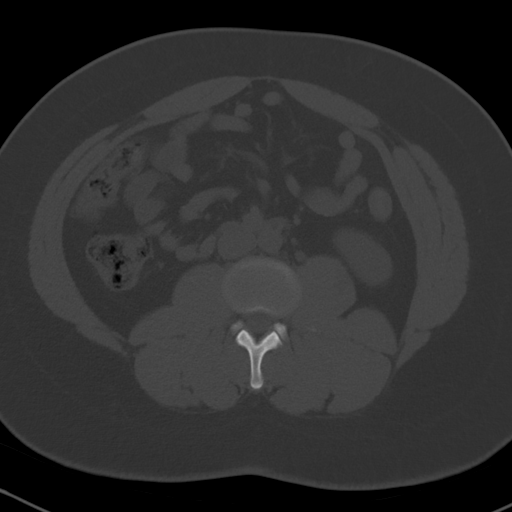
[im 65/96  soft-tissue]
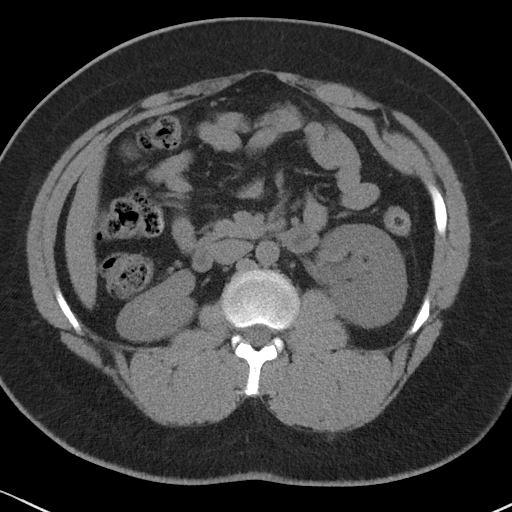
[im 73/96  soft-tissue]
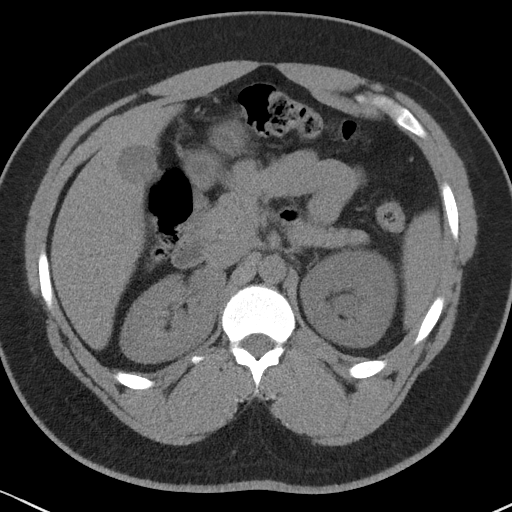
[im 77/96  soft-tissue]
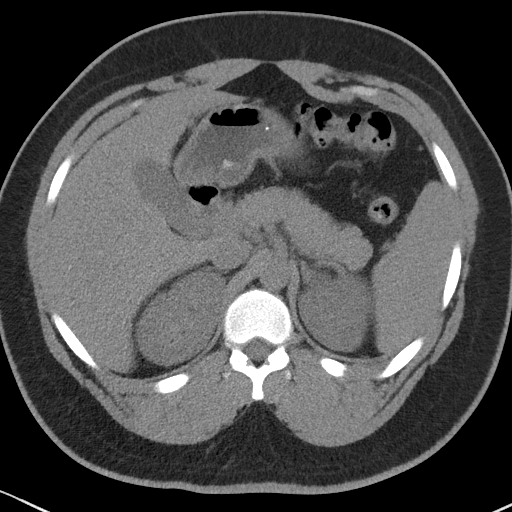
[im 84/96  soft-tissue]
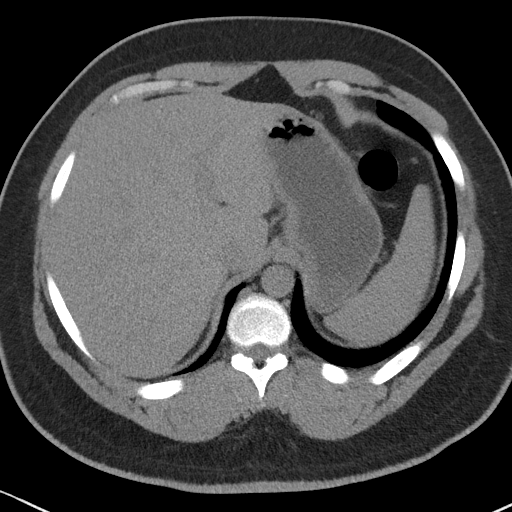
[im 92/96  soft-tissue]
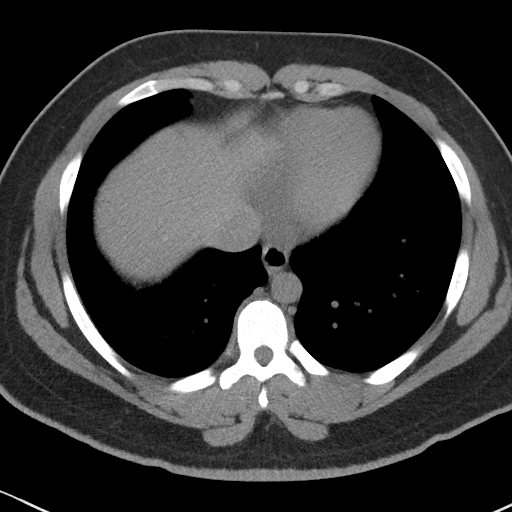

[Series 5: coronal · coronal · 0.88mm/px · 3 of 168 slices shown]
[im 56/168  soft-tissue]
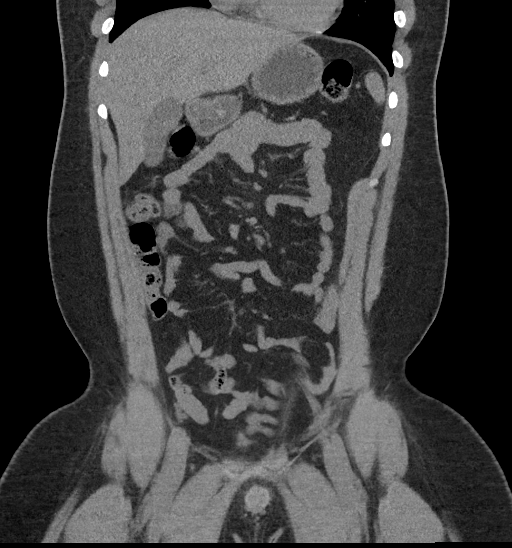
[im 75/168  soft-tissue]
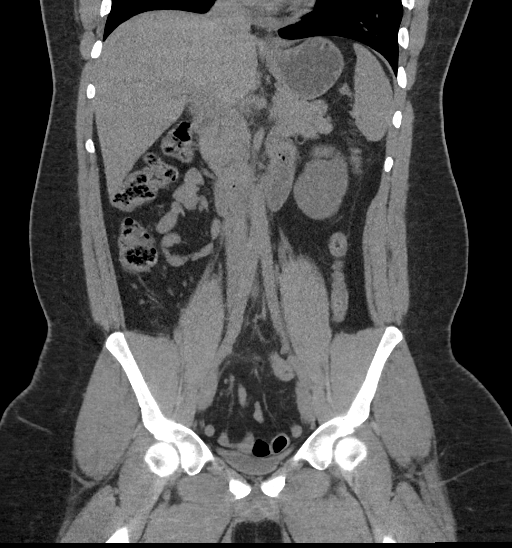
[im 93/168  soft-tissue]
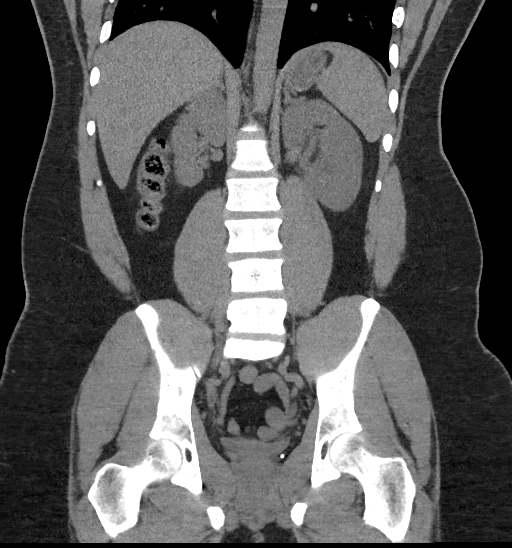

[17 of 46 positions shown; findings below may reference images not displayed]

FINDINGS: Lower chest: No acute abnormality.

Hepatobiliary: No focal liver abnormality is seen. No gallstones,
gallbladder wall thickening, or biliary dilatation.

Pancreas: Unremarkable. No pancreatic ductal dilatation or
surrounding inflammatory changes.

Spleen: Normal in size without focal abnormality.

Adrenals/Urinary Tract: Adrenal glands are unremarkable. Punctate
nonobstructing kidney stones bilaterally. Mild left hydronephrosis
and perinephric stranding with a 3 mm stone at the left
ureterovesicular junction (series 2, image 79 and series 5, image
98). No right ureter stone. Normal bladder.

Stomach/Bowel: Stomach is within normal limits. Appendix not
identified, no pericecal inflammation.. No evidence of bowel wall
thickening, distention, or inflammatory changes.

Vascular/Lymphatic: No significant vascular findings are present. No
enlarged abdominal or pelvic lymph nodes.

Reproductive: Prostate is unremarkable.

Other: No abdominal wall hernia or abnormality. No abdominopelvic
ascites.

Musculoskeletal: No acute or significant osseous findings.
IMPRESSION: 1. Mild left hydronephrosis with 3 mm ureter stone at the left
ureterovesicular junction.
2. Punctate nonobstructing kidney stones bilaterally.

By: Guti Zeng M.D.

## 2020-06-01 IMAGING — US US RENAL
1 series · 14 of 25 positions shown · non-contrast
Comparison: CT Abdomen and Pelvis 07/06/2018

CLINICAL DATA: 27-year-old male with left side obstructive uropathy
due to ureteral calculus last month. Nephrolithiasis.

EXAM:
RENAL / URINARY TRACT ULTRASOUND COMPLETE

[Series 1: us renal · 0.26mm/px · 14 of 33 slices shown]
[im 1/33]
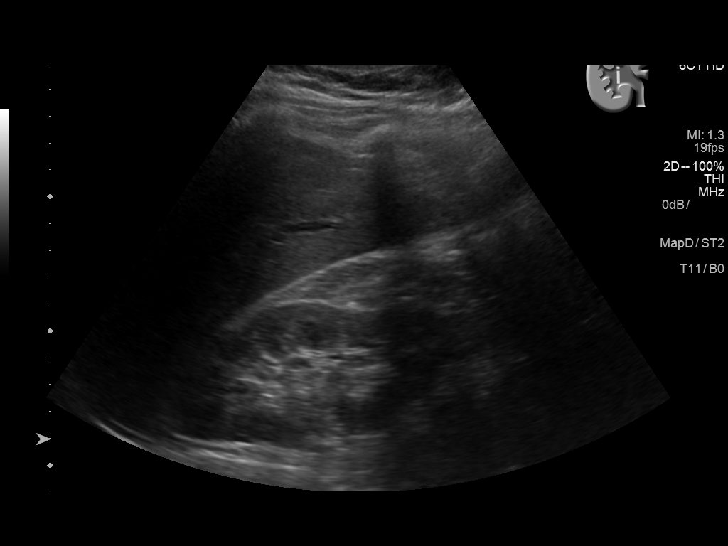
[im 3/33]
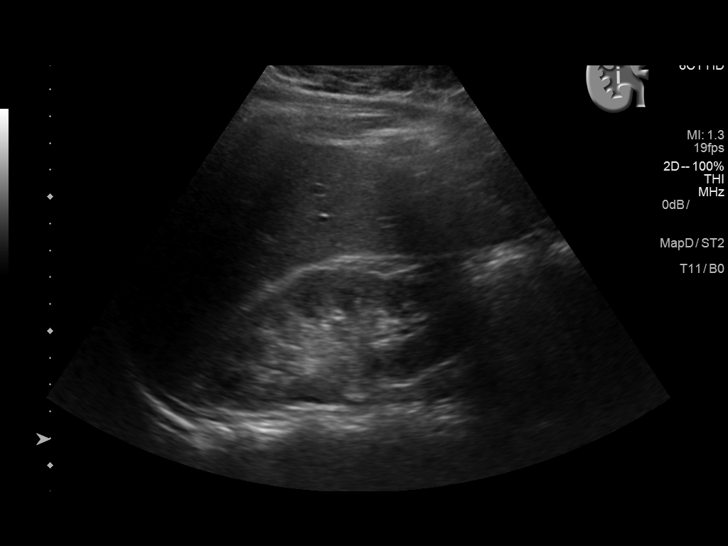
[im 6/33]
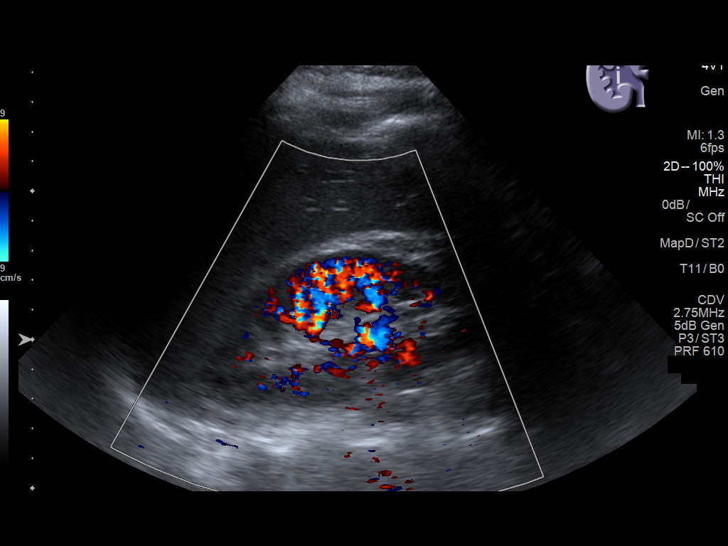
[im 9/33]
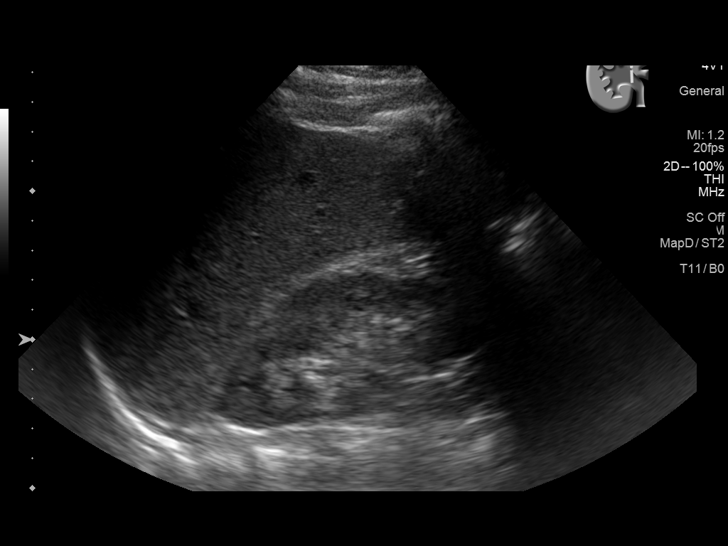
[im 11/33]
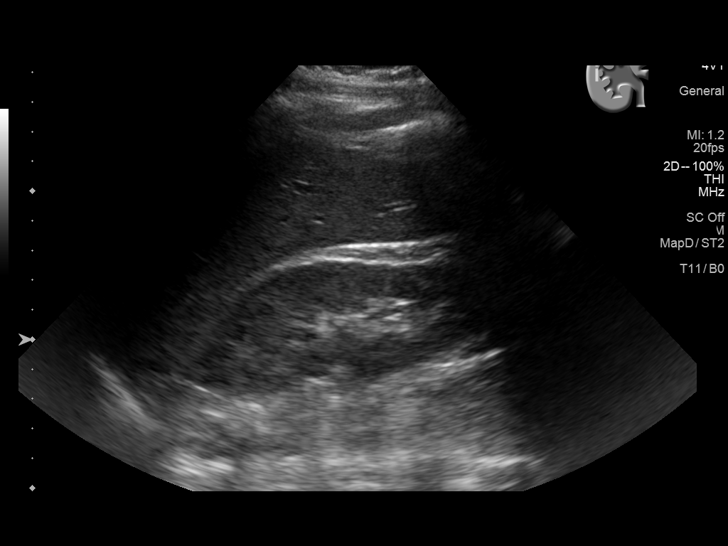
[im 13/33]
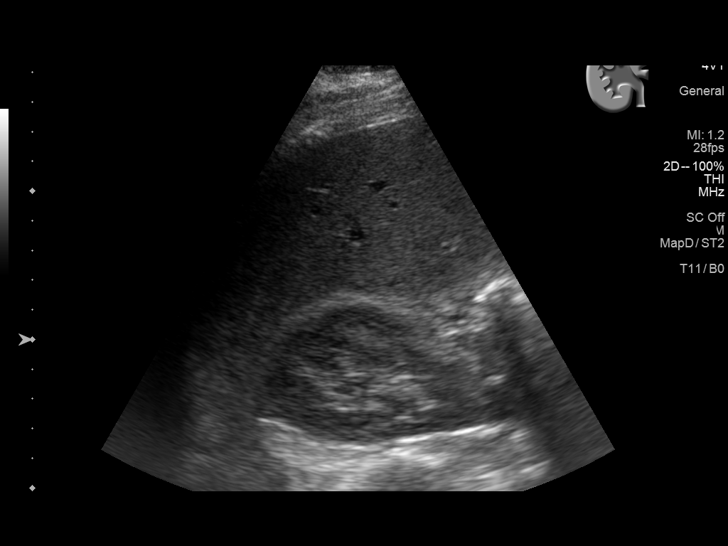
[im 15/33]
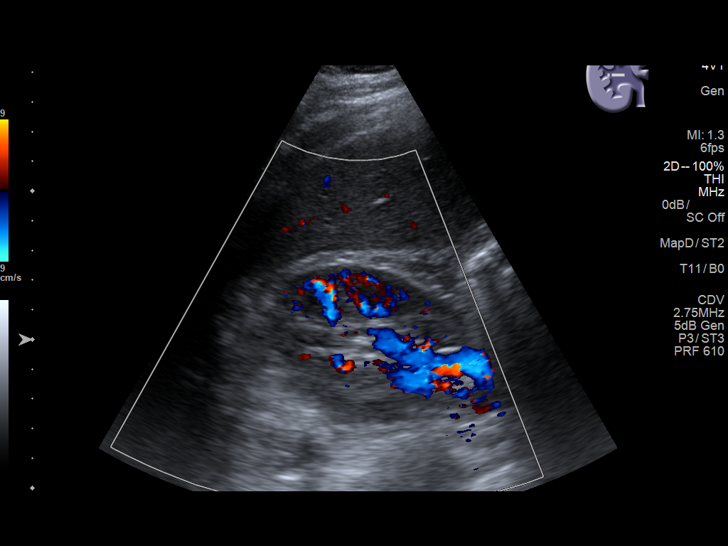
[im 18/33]
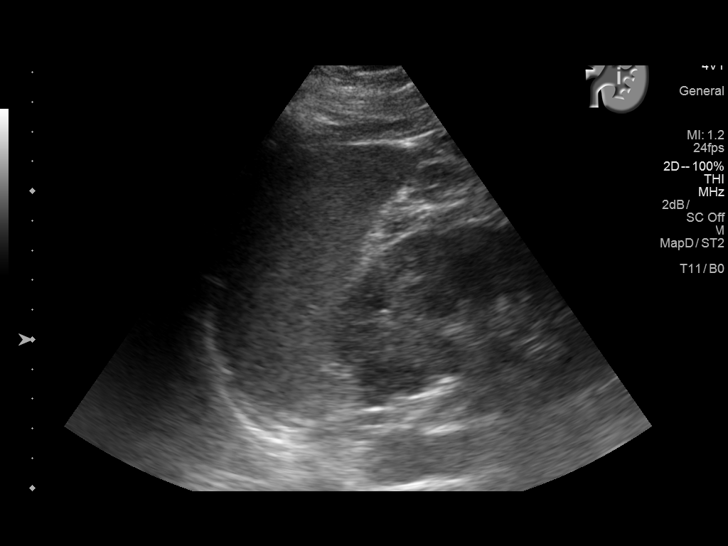
[im 21/33]
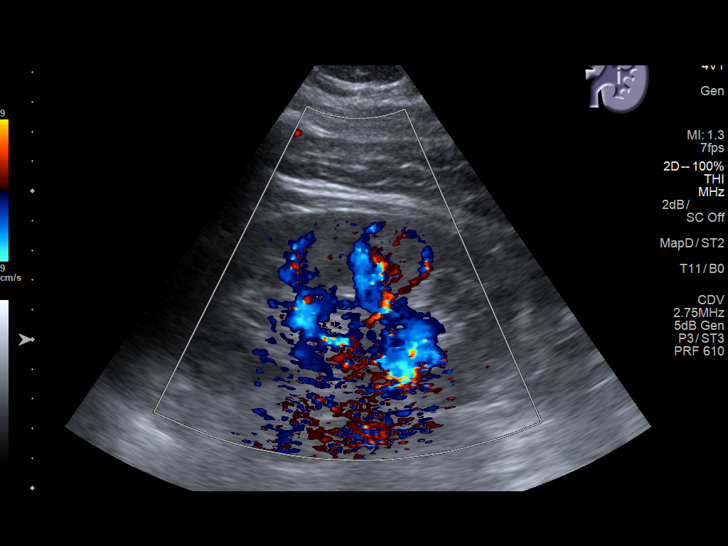
[im 22/33]
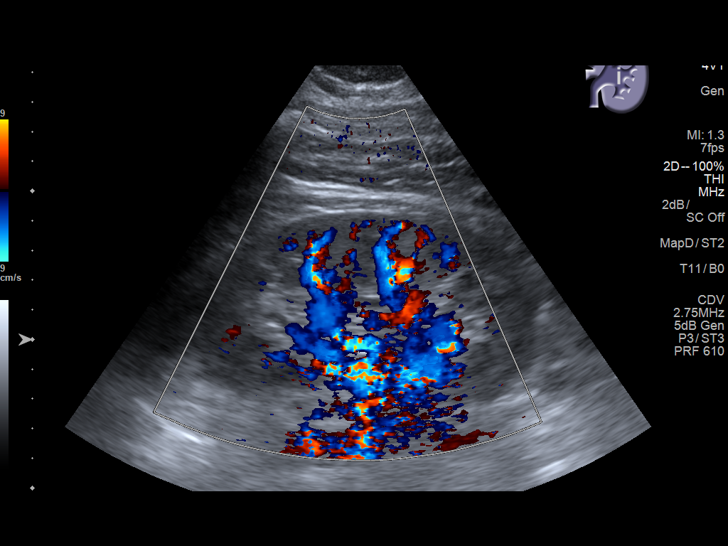
[im 25/33]
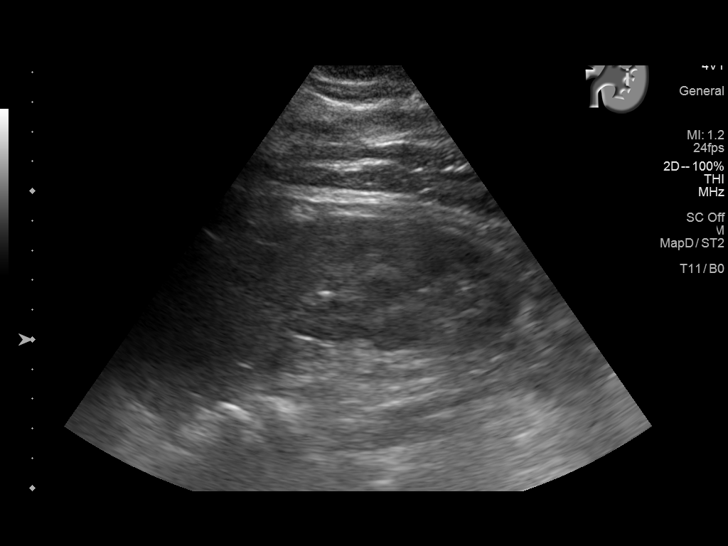
[im 27/33]
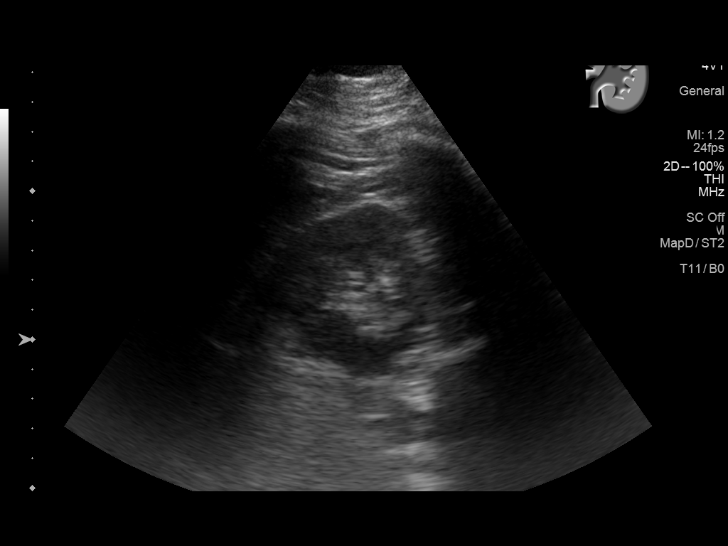
[im 30/33]
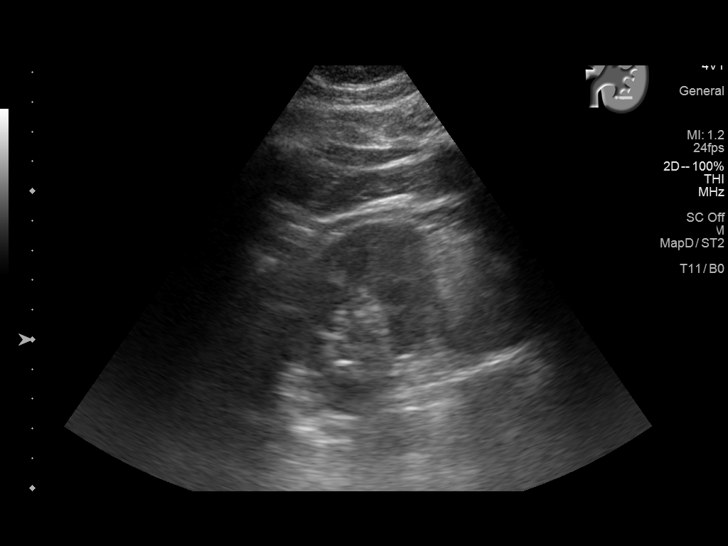
[im 33/33]
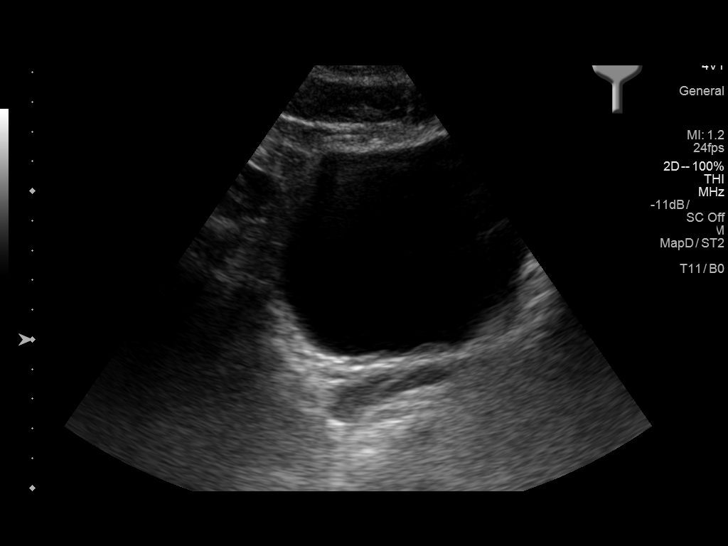

[14 of 25 positions shown; findings below may reference images not displayed]

FINDINGS: Right Kidney:

Length: 11.9 centimeters. Echogenicity within normal limits. No mass
or hydronephrosis visualized.

Left Kidney:

Length: 12.3 centimeters. Resolved left hydronephrosis. Normal
cortical echogenicity. Punctate left lower pole region
nephrolithiasis is not evident today. No left renal mass or lesion.

Bladder:

Appears normal for degree of bladder distention.
IMPRESSION: 1. Resolved left hydronephrosis since 07/06/2018. Left
nephrolithiasis seen at that time is not evident by ultrasound.
2. Otherwise normal ultrasound appearance of both kidneys and
urinary bladder.

## 2020-06-17 ENCOUNTER — Encounter: Payer: Self-pay | Admitting: Emergency Medicine

## 2020-06-17 ENCOUNTER — Other Ambulatory Visit: Payer: Self-pay

## 2020-06-17 ENCOUNTER — Ambulatory Visit
Admission: EM | Admit: 2020-06-17 | Discharge: 2020-06-17 | Disposition: A | Discharge status: Home / Self Care | Payer: 59 | Attending: Physician Assistant | Admitting: Physician Assistant

## 2020-06-17 DIAGNOSIS — T2012XA Burn of first degree of lip(s), initial encounter: Secondary | ICD-10-CM | POA: Diagnosis not present

## 2020-06-17 DIAGNOSIS — K130 Diseases of lips: Secondary | ICD-10-CM | POA: Diagnosis not present

## 2020-06-17 MED ORDER — MUPIROCIN 2 % EX OINT: 1.0000 "application " | TOPICAL_OINTMENT | Freq: Three times a day (TID) | CUTANEOUS | 0 refills | Status: AC

## 2020-06-17 NOTE — ED Provider Notes (Signed)
[facility]    CSN: [id number] Arrival date & time: 06/17/20  1648      History   Chief Complaint Chief Complaint  Patient presents with  . Mouth Lesions    HPI Tyler Holt is a 29 y.o. male.   29 year old male presents for concerns about his upper lip.  He says he was eating lasagna yesterday and after that noticed some tingling and burning sensation of the upper lip.  He says when he looked he saw a cluster of very small bumps.  He denies any pain in the area of the bumps but says the left side of the upper lip is painful.  Patient says he just wants to make sure it is not anything serious and does not look like an STD.  He has not treated it in any way.  Patient denies lip swelling, throat swelling, facial swelling, throat tightness or trouble breathing.  He has no known allergies.  He denies sore throat, cough, congestion, fever.  No history of herpes.  No other concerns today.     Past Medical History:  Diagnosis Date  . No known health problems     Patient Active Problem List   Diagnosis Date Noted  . Closed bimalleolar fracture of left ankle 02/07/2016    Past Surgical History:  Procedure Laterality Date  . ANKLE FRACTURE SURGERY    . 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 Authorizing Provider  ibuprofen (ADVIL,MOTRIN) 800 MG tablet Take by mouth.    [provider]  mupirocin ointment (BACTROBAN) 2 % Apply 1 application topically 3 (three) times daily for 7 days. 06/17/20 06/24/20  Eusebio Friendly B, PA-C  naproxen sodium (ALEVE) 220 MG tablet Take 220 mg by mouth daily as needed.    [provider]    Family History Family History  Problem Relation Age of Onset  . Hypertension Mother   . Hypertension Father     Social History Social History   Tobacco Use  . Smoking status: Never Smoker  . Smokeless tobacco: Never Used    Vaping Use  . Vaping Use: Never used  Substance Use Topics  . Alcohol use: Yes    Comment: socially drink  . Drug use: No     Allergies   Patient has no known allergies.   Review of Systems Review of Systems  Constitutional: Negative for fatigue and fever.  HENT: Positive for congestion and mouth sores. Negative for facial swelling, sore throat, trouble swallowing and voice change.   Respiratory: Negative for chest tightness and shortness of breath.   Cardiovascular: Negative for chest pain.  Gastrointestinal: Negative for nausea and vomiting.  Genitourinary: Negative for discharge and dysuria.  Skin: Negative for rash.  Neurological: Negative for weakness and headaches.     Physical Exam Triage Vital Signs ED Triage Vitals  Enc Vitals Group     BP 06/17/20 1710 128/74     Pulse Rate 06/17/20 1710 71     Resp 06/17/20 1710 16     Temp 06/17/20 1710 98.7 F (37.1 C)     Temp Source 06/17/20 1710 Oral     SpO2 06/17/20 1710 97 %     Weight 06/17/20 1708 277 lb (125.6 kg)     Height 06/17/20 1708 5\' 11"  (1.803 m)     Head Circumference --  Peak Flow --      Pain Score 06/17/20 1708 2     Pain Loc --      Pain Edu? --      Excl. in GC? --    No data found.  Updated Vital Signs BP 128/74 (BP Location: Right Arm)   Pulse 71   Temp 98.7 F (37.1 C) (Oral)   Resp 16   Ht 5\' 11"  (1.803 m)   Wt 277 lb (125.6 kg)   SpO2 97%   BMI 38.63 kg/m       Physical Exam Vitals and nursing note reviewed.  Constitutional:      General: He is not in acute distress.    Appearance: Normal appearance. He is well-developed. He is not ill-appearing or toxic-appearing.  HENT:     Head: Normocephalic and atraumatic.     Nose: Nose normal.     Mouth/Throat:     Lips: Lesions (there is an area of superficial burn of the lip on the left. No lesions or blisters noted. Area mildly TTP) present.     Mouth: Mucous membranes are moist.     Palate: No lesions.     Pharynx:  Oropharynx is clear. No pharyngeal swelling.  Eyes:     General: No scleral icterus.    Extraocular Movements: Extraocular movements intact.     Conjunctiva/sclera: Conjunctivae normal.     Pupils: Pupils are equal, round, and reactive to light.  Cardiovascular:     Rate and Rhythm: Normal rate and regular rhythm.     Heart sounds: Normal heart sounds. No murmur heard.   Pulmonary:     Effort: Pulmonary effort is normal. No respiratory distress.     Breath sounds: Normal breath sounds.  Abdominal:     General: Bowel sounds are normal.     Palpations: Abdomen is soft.     Tenderness: There is no abdominal tenderness.  Musculoskeletal:        General: No swelling, tenderness, deformity or signs of injury. Normal range of motion.     Cervical back: Normal range of motion and neck supple.  Skin:    General: Skin is warm and dry.     Findings: No rash.  Neurological:     General: No focal deficit present.     Mental Status: He is alert. Mental status is at baseline.     Motor: No weakness.     Coordination: Coordination normal.     Gait: Gait normal.  Psychiatric:        Mood and Affect: Mood normal.        Behavior: Behavior normal.        Thought Content: Thought content normal.      UC Treatments / Results  Labs (all labs ordered are listed, but only abnormal results are displayed) Labs Reviewed - No data to display  EKG   Radiology No results found.  Procedures Procedures (including critical care time)  Medications Ordered in UC Medications - No data to display  Initial Impression / Assessment and Plan / UC Course  I have reviewed the triage vital signs and the nursing notes.  Pertinent labs & imaging results that were available during my care of the patient were reviewed by me and considered in my medical decision making (see chart for details).   Advised patient that his exam is consistent with a very minor burn of the lip.  Explained may be the lasagna was  hot and burned the  lip.  Advised him to use cool compresses and may use mupirocin ointment very sparingly on the lip to prevent infection.  Advised him to follow-up for any increased swelling or pain.  Also follow-up if he notices any mouth lesions, but I did not see any today.  He declines AVS.   Final Clinical Impressions(s) / UC Diagnoses   Final diagnoses:  Lip pain  Superficial burn of lip, initial encounter   Discharge Instructions   None    ED Prescriptions    Medication Sig Dispense Auth. Provider   mupirocin ointment (BACTROBAN) 2 % Apply 1 application topically 3 (three) times daily for 7 days. 21 g Shirlee Latch, PA-C     PDMP not reviewed this encounter.   Shirlee Latch, PA-C 06/17/20 1757

## 2020-06-17 NOTE — ED Triage Notes (Signed)
Patient c/o burning and tingling in his lips that started yesterday.  Patient also states that he noticed some sores on his upper lips today.
